# Patient Record
Sex: Male | Born: 1995 | Hispanic: Yes | Marital: Single | Smoking: Current some day smoker
Health system: Southern US, Community
[De-identification: ages and names within clinical notes are randomized; demographics above are authoritative.]

---

## 2017-09-23 ENCOUNTER — Emergency Department (HOSPITAL_COMMUNITY): Payer: No Typology Code available for payment source

## 2017-09-23 ENCOUNTER — Other Ambulatory Visit: Payer: Self-pay

## 2017-09-23 ENCOUNTER — Encounter (HOSPITAL_COMMUNITY): Payer: Self-pay | Admitting: Emergency Medicine

## 2017-09-23 ENCOUNTER — Emergency Department (HOSPITAL_COMMUNITY)
Admission: EM | Admit: 2017-09-23 | Discharge: 2017-09-23 | Disposition: A | Discharge status: Home / Self Care | Payer: No Typology Code available for payment source | Attending: Emergency Medicine | Admitting: Emergency Medicine

## 2017-09-23 DIAGNOSIS — S8002XA Contusion of left knee, initial encounter: Secondary | ICD-10-CM | POA: Insufficient documentation

## 2017-09-23 DIAGNOSIS — Y9389 Activity, other specified: Secondary | ICD-10-CM | POA: Diagnosis not present

## 2017-09-23 DIAGNOSIS — Y999 Unspecified external cause status: Secondary | ICD-10-CM | POA: Insufficient documentation

## 2017-09-23 DIAGNOSIS — S8001XA Contusion of right knee, initial encounter: Secondary | ICD-10-CM | POA: Diagnosis not present

## 2017-09-23 DIAGNOSIS — F17210 Nicotine dependence, cigarettes, uncomplicated: Secondary | ICD-10-CM | POA: Diagnosis not present

## 2017-09-23 DIAGNOSIS — S8000XA Contusion of unspecified knee, initial encounter: Secondary | ICD-10-CM

## 2017-09-23 DIAGNOSIS — Y92410 Unspecified street and highway as the place of occurrence of the external cause: Secondary | ICD-10-CM | POA: Diagnosis not present

## 2017-09-23 DIAGNOSIS — S20211A Contusion of right front wall of thorax, initial encounter: Secondary | ICD-10-CM | POA: Diagnosis not present

## 2017-09-23 DIAGNOSIS — S299XXA Unspecified injury of thorax, initial encounter: Secondary | ICD-10-CM | POA: Diagnosis present

## 2017-09-23 MED ORDER — IBUPROFEN 600 MG PO TABS: 600.0000 mg | ORAL_TABLET | Freq: Four times a day (QID) | ORAL | 0 refills | Status: AC

## 2017-09-23 MED ORDER — CYCLOBENZAPRINE HCL 10 MG PO TABS: 10.0000 mg | ORAL_TABLET | Freq: Three times a day (TID) | ORAL | 0 refills | Status: AC

## 2017-09-23 MED ORDER — MORPHINE SULFATE (PF) 4 MG/ML IV SOLN
4.0000 mg | Freq: Once | INTRAVENOUS | Status: AC
  Administered 2017-09-23: 4 mg via INTRAVENOUS
  Filled 2017-09-23: qty 1

## 2017-09-23 MED ORDER — ONDANSETRON HCL 4 MG/2ML IJ SOLN
4.0000 mg | Freq: Once | INTRAMUSCULAR | Status: AC
  Administered 2017-09-23: 4 mg via INTRAVENOUS
  Filled 2017-09-23: qty 2

## 2017-09-23 MED ORDER — IOPAMIDOL (ISOVUE-300) INJECTION 61%
100.0000 mL | Freq: Once | INTRAVENOUS | Status: AC | PRN
  Administered 2017-09-23: 100 mL via INTRAVENOUS

## 2017-09-23 NOTE — ED Notes (Signed)
Pt to Xray.
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
## 2017-09-23 NOTE — ED Notes (Signed)
20 gauge to right AC placed by EMS was removed in triage - cath intact- bandage applied.
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
## 2017-09-23 NOTE — ED Notes (Signed)
Pt back from X-ray.  
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
## 2017-09-23 NOTE — Discharge Instructions (Signed)
Your oxygen level is 100% on room air.  The CT scan of your chest, abdomen, pelvis, and cervical spine are all negative for fracture or dislocation.  You can expect to be sore over the next few days.  Please use Flexeril 3 times daily. This medication may cause drowsiness. Please do not drink, drive, or participate in activity that requires concentration while taking this medication.  Use ibuprofen every 6 hours for soreness.  Please return to the emergency department if any changes or problems.
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
